# Patient Record
Sex: Male | Born: 1960 | Hispanic: Yes | Marital: Married | Smoking: Never smoker
Health system: Southern US, Community
[De-identification: ages and names within clinical notes are randomized; demographics above are authoritative.]

## PROBLEM LIST (undated history)

## (undated) DIAGNOSIS — I10 Essential (primary) hypertension: Secondary | ICD-10-CM

---

## 2014-08-23 ENCOUNTER — Emergency Department: Payer: Self-pay | Admitting: Emergency Medicine

## 2020-08-13 ENCOUNTER — Encounter: Payer: Self-pay | Admitting: Emergency Medicine

## 2020-08-13 ENCOUNTER — Emergency Department
Admission: EM | Admit: 2020-08-13 | Discharge: 2020-08-13 | Disposition: A | Discharge status: Home / Self Care | Payer: Worker's Compensation | Attending: Emergency Medicine | Admitting: Emergency Medicine

## 2020-08-13 ENCOUNTER — Emergency Department: Payer: Worker's Compensation

## 2020-08-13 ENCOUNTER — Other Ambulatory Visit: Payer: Self-pay

## 2020-08-13 DIAGNOSIS — M7022 Olecranon bursitis, left elbow: Secondary | ICD-10-CM | POA: Diagnosis not present

## 2020-08-13 DIAGNOSIS — I10 Essential (primary) hypertension: Secondary | ICD-10-CM | POA: Insufficient documentation

## 2020-08-13 DIAGNOSIS — M25522 Pain in left elbow: Secondary | ICD-10-CM | POA: Diagnosis present

## 2020-08-13 DIAGNOSIS — W228XXA Striking against or struck by other objects, initial encounter: Secondary | ICD-10-CM | POA: Insufficient documentation

## 2020-08-13 DIAGNOSIS — Y9389 Activity, other specified: Secondary | ICD-10-CM | POA: Insufficient documentation

## 2020-08-13 DIAGNOSIS — Y990 Civilian activity done for income or pay: Secondary | ICD-10-CM | POA: Diagnosis not present

## 2020-08-13 HISTORY — DX: Essential (primary) hypertension: I10

## 2020-08-13 NOTE — ED Provider Notes (Signed)
[facility] Emergency Department Provider Note  ____________________________________________   Event Date/Time   First MD Initiated Contact with Patient 08/13/20 [phone]     (approximate)  I have reviewed the triage vital signs and the nursing notes.   HISTORY  Chief Complaint Elbow Pain    HPI Christopher Strong is a 60 y.o. male presents emergency department complaining of left elbow pain.  Patient states he hit it about 3 weeks ago at work.  Continues to have swelling at the elbow.  No fever or chills.  No other injuries reported.    Past Medical History:  Diagnosis Date  . Hypertension     There are no problems to display for this patient.   History reviewed. No pertinent surgical history.  Prior to Admission medications   Not on File    Allergies Patient has no known allergies.  No family history on file.  Social History Social History   Tobacco Use  . Smoking status: Never Smoker  . Smokeless tobacco: Never Used  Substance Use Topics  . Alcohol use: Never    Review of Systems  Constitutional: No fever/chills Eyes: No visual changes. ENT: No sore throat. Respiratory: Denies cough Genitourinary: Negative for dysuria. Musculoskeletal: Negative for back pain.  Positive right elbow pain Skin: Negative for rash. Psychiatric: no mood changes,     ____________________________________________   PHYSICAL EXAM:  VITAL SIGNS: ED Triage Vitals  Enc Vitals Group     BP 08/13/20 0926 (!) 160/86     Pulse Rate 08/13/20 0926 64     Resp 08/13/20 0926 14     Temp 08/13/20 0926 97.9 F (36.6 C)     Temp Source 08/13/20 0926 Oral     SpO2 08/13/20 0926 100 %     Weight --      Height 08/13/20 0925 5' (1.524 m)     Head Circumference --      Peak Flow --      Pain Score 08/13/20 0923 9     Pain Loc --      Pain Edu? --      Excl. in GC? --     Constitutional: Alert and oriented. Well appearing and in no acute  distress. Eyes: Conjunctivae are normal.  Head: Atraumatic. Nose: No congestion/rhinnorhea. Mouth/Throat: Mucous membranes are moist.   Neck:  supple no lymphadenopathy noted Cardiovascular: Normal rate, regular rhythm.  Respiratory: Normal respiratory effort.  No retractions, GU: deferred Musculoskeletal: FROM all extremities, warm and well perfused, olecranon bursa is swollen and tender, full range of motion of the left elbow, neurovascular intact Neurologic:  Normal speech and language.  Skin:  Skin is warm, dry and intact. No rash noted. Psychiatric: Mood and affect are normal. Speech and behavior are normal.  ____________________________________________   LABS (all labs ordered are listed, but only abnormal results are displayed)  Labs Reviewed - No data to display ____________________________________________   ____________________________________________  RADIOLOGY  X-ray of the left elbow  ____________________________________________   PROCEDURES  Procedure(s) performed: No  Procedures    ____________________________________________   INITIAL IMPRESSION / ASSESSMENT AND PLAN / ED COURSE  Pertinent labs & imaging results that were available during my care of the patient were reviewed by me and considered in my medical decision making (see chart for details).   Patient is a 60 year old male presents with left elbow injury.  See HPI.  Physical exam shows patient to appear stable  X-ray of the left elbow  X-ray left elbow reviewed by me confirmed by radiology is negative for any fracture, soft tissue swelling noted  I did explain everything via the [facility] interpreter.  Patient has olecranon bursitis secondary to trauma.  Explained to him that if we drained the area it will just feel back up with blood..  We placed a Ace wrap on the left elbow.  He is apply ice.  Take Tylenol or ibuprofen for pain as needed.  Return if worsening.  Follow-up orthopedics if not  better in 7 to 10 days.     Christopher Strong was evaluated in Emergency Department on 08/13/2020 for the symptoms described in the history of present illness. He was evaluated in the context of the global COVID-19 pandemic, which necessitated consideration that the patient might be at risk for infection with the SARS-CoV-2 virus that causes COVID-19. Institutional protocols and algorithms that pertain to the evaluation of patients at risk for COVID-19 are in a state of rapid change based on information released by regulatory bodies including the CDC and federal and state organizations. These policies and algorithms were followed during the patient's care in the ED.    As part of my medical decision making, I reviewed the following data within the electronic MEDICAL RECORD NUMBER Nursing notes reviewed and incorporated, Interpreter needed, Old chart reviewed, Radiograph reviewed , Notes from prior ED visits and [place] Controlled Substance Database  ____________________________________________   FINAL CLINICAL IMPRESSION(S) / ED DIAGNOSES  Final diagnoses:  Olecranon bursitis of left elbow      NEW MEDICATIONS STARTED DURING THIS VISIT:  New Prescriptions   No medications on file     Note:  This document was prepared using Dragon voice recognition software and may include unintentional dictation errors.    Faythe Ghee, PA-C 08/13/20 1043    Phineas Semen, MD 08/13/20 1125

## 2020-08-13 NOTE — ED Notes (Signed)
See triage note  Presents with swelling to left elbow  States he hit it about 3 weeks ago conts to have swelling at elbow area

## 2020-08-13 NOTE — ED Triage Notes (Signed)
Injured left elbow at work 3 weeks ago.  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  Has knot on elbow

## 2021-07-08 IMAGING — DX DG ELBOW COMPLETE 3+V*L*
4 series · 4 of 4 positions shown · non-contrast
Comparison: None

CLINICAL DATA: Hit LEFT elbow 3 weeks ago, continued pain and
swelling, knot at posterior elbow

EXAM:
LEFT ELBOW - COMPLETE 3+ VIEW

[elbow ap]
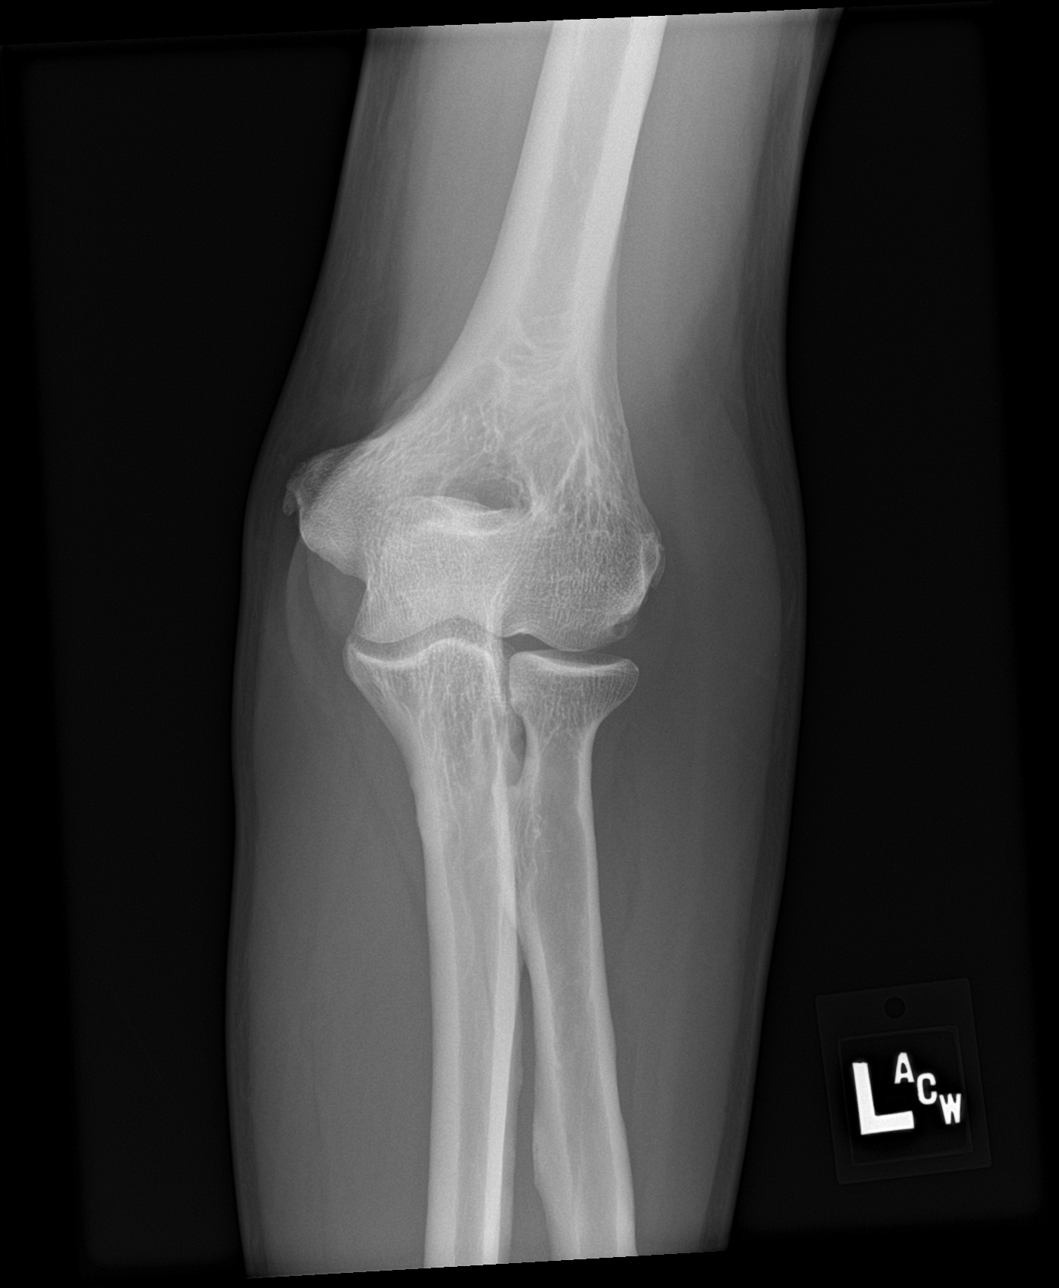

[elbow obl (1 of 2)]
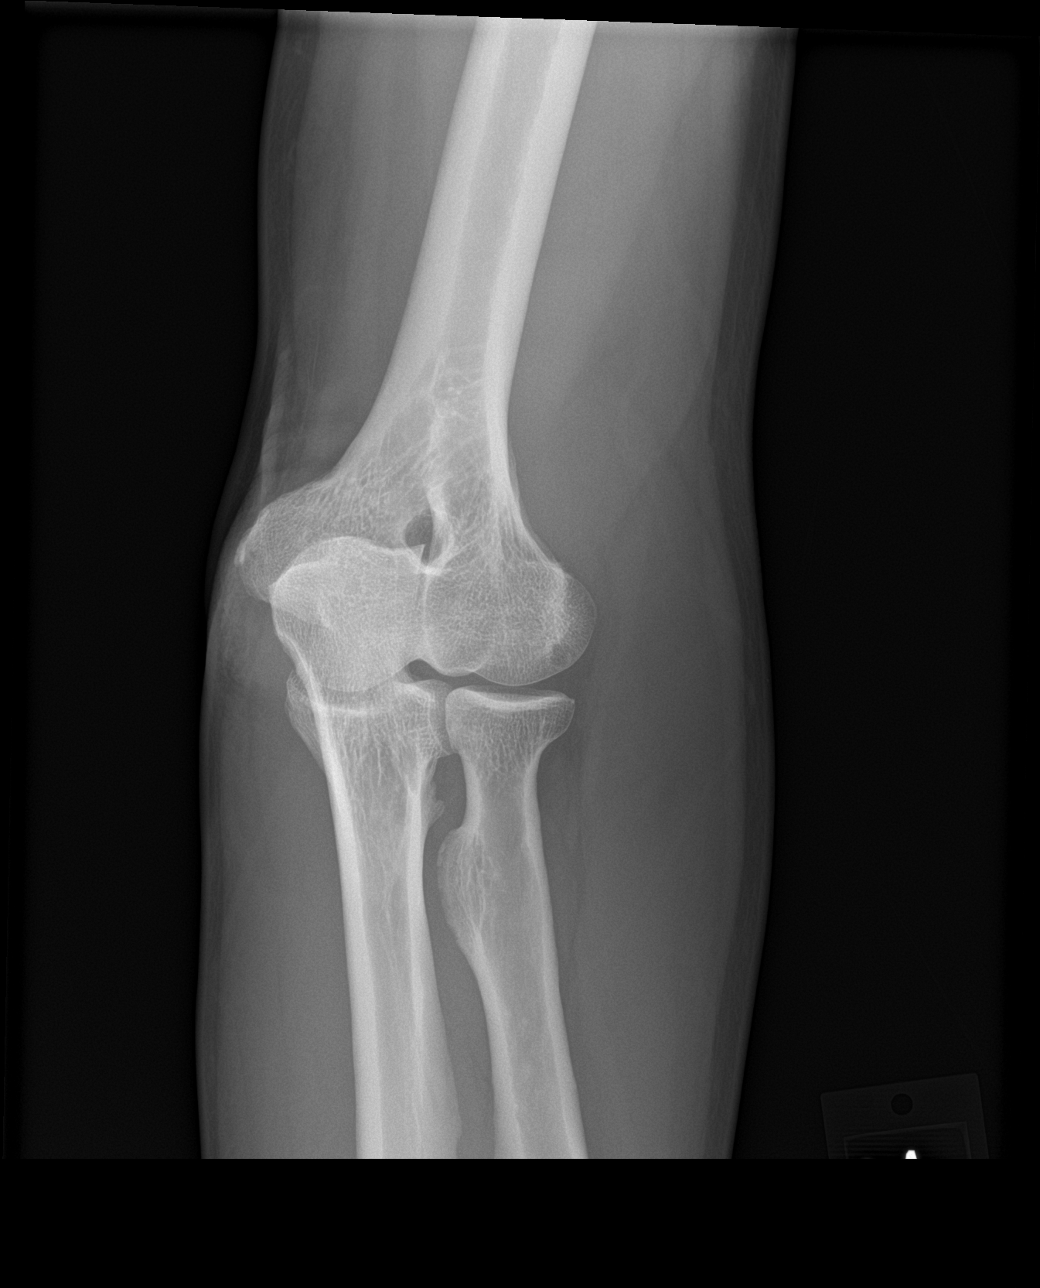

[elbow obl (2 of 2)]
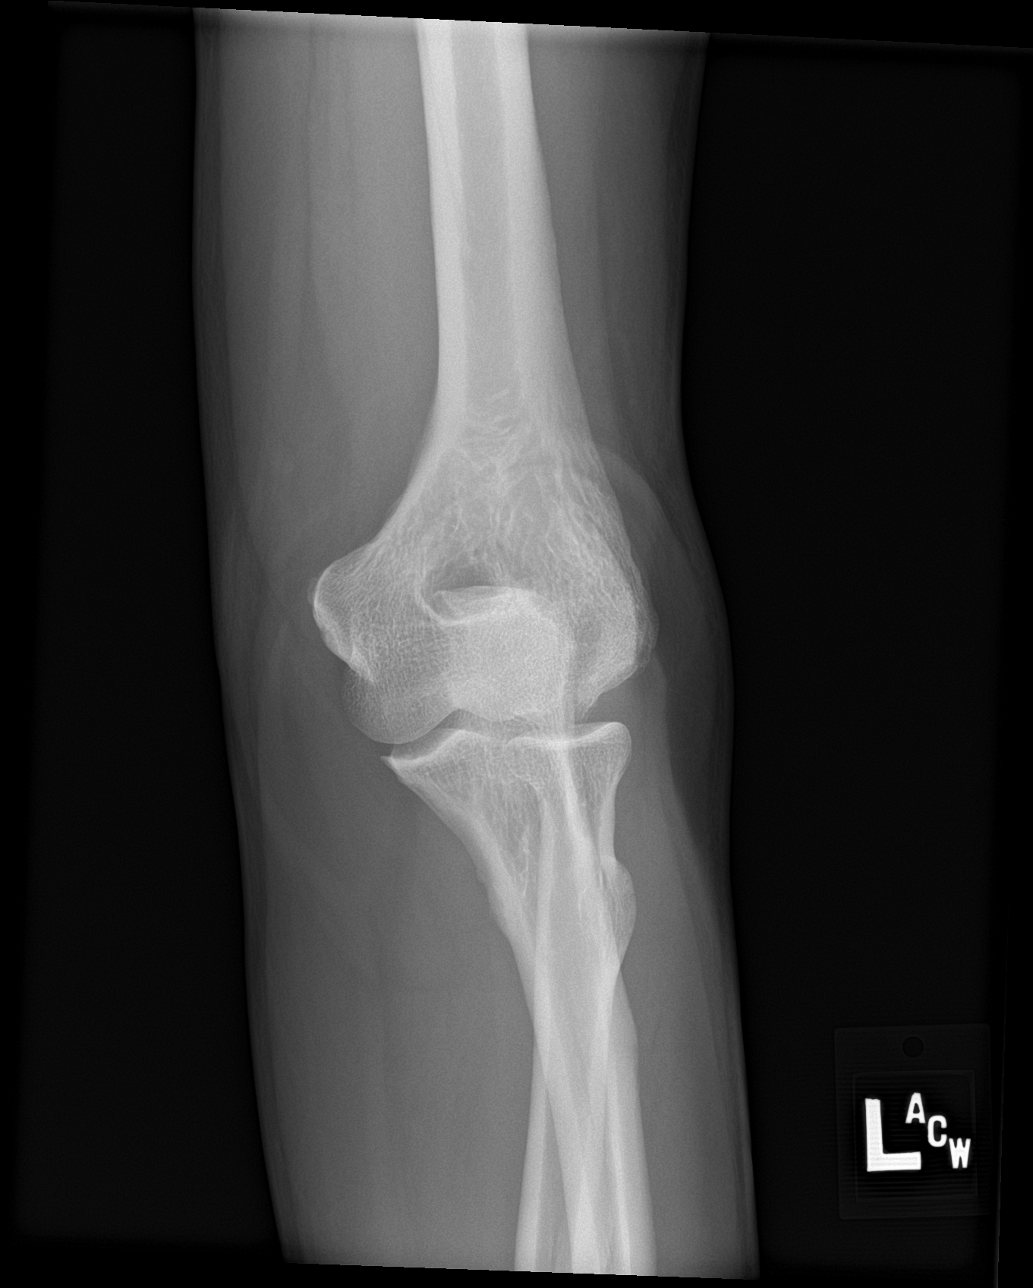

[elbow lat]
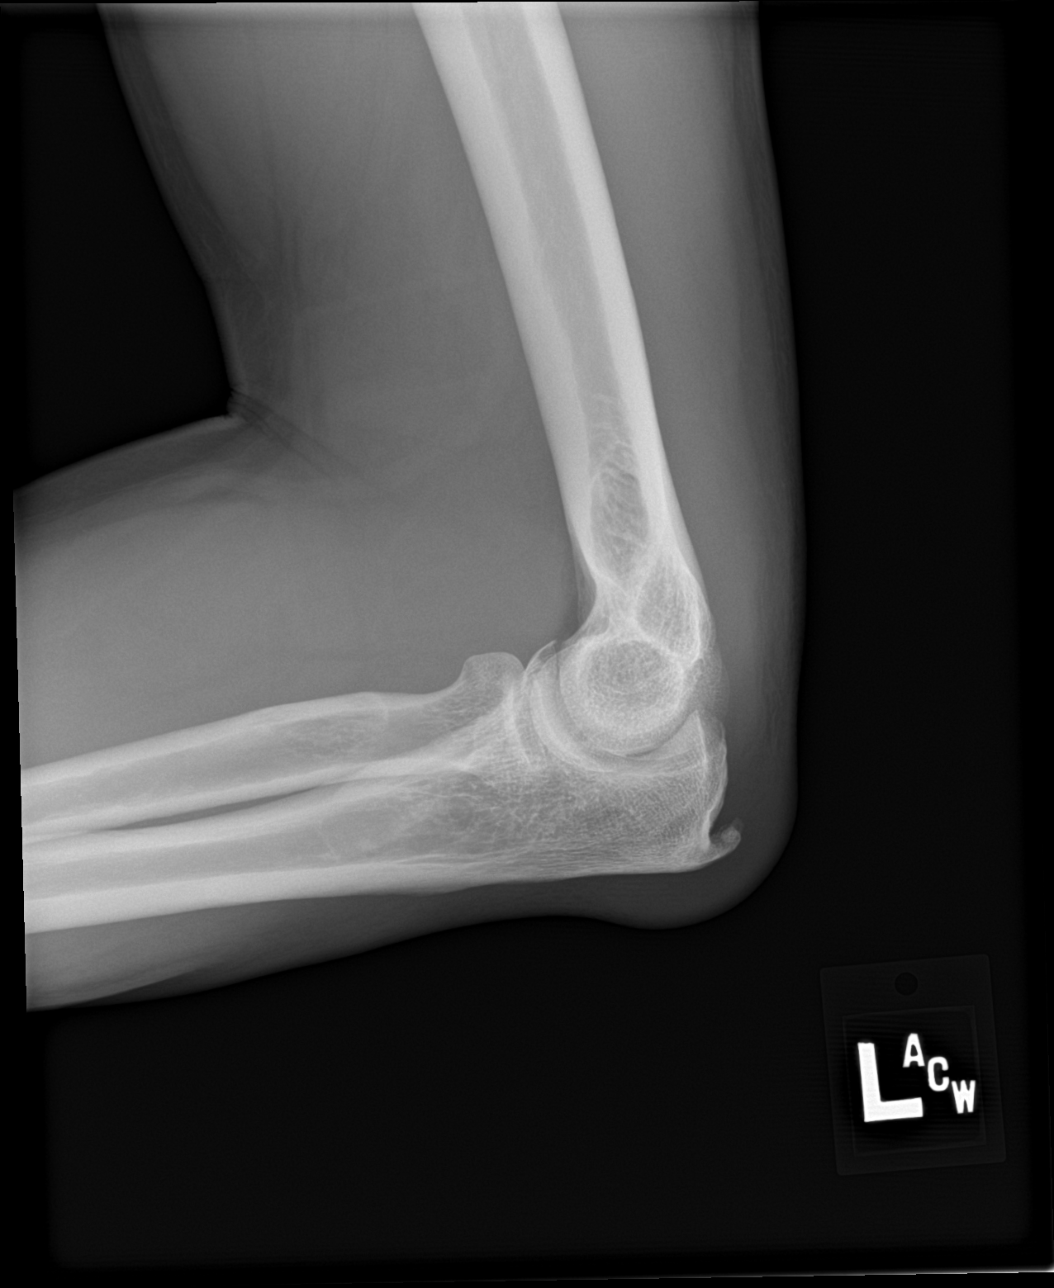

[4 of 4 positions shown; findings below may reference images not displayed]

FINDINGS: Mild dorsal soft tissue swelling overlying olecranon.

Small echo non spur present.

Osseous mineralization normal.

Joint spaces preserved.

No acute fracture, dislocation, bone destruction or joint effusion.
IMPRESSION: Olecranon spurring with overlying soft tissue swelling.

No acute osseous abnormalities.

## 2022-04-09 ENCOUNTER — Other Ambulatory Visit: Payer: Self-pay

## 2022-04-09 ENCOUNTER — Emergency Department
Admission: EM | Admit: 2022-04-09 | Discharge: 2022-04-09 | Disposition: A | Discharge status: Home / Self Care | Payer: Worker's Compensation | Attending: Emergency Medicine | Admitting: Emergency Medicine

## 2022-04-09 ENCOUNTER — Emergency Department: Payer: Worker's Compensation

## 2022-04-09 ENCOUNTER — Encounter: Payer: Self-pay | Admitting: Emergency Medicine

## 2022-04-09 DIAGNOSIS — S8991XA Unspecified injury of right lower leg, initial encounter: Secondary | ICD-10-CM | POA: Diagnosis present

## 2022-04-09 DIAGNOSIS — Y9301 Activity, walking, marching and hiking: Secondary | ICD-10-CM | POA: Diagnosis not present

## 2022-04-09 DIAGNOSIS — X500XXA Overexertion from strenuous movement or load, initial encounter: Secondary | ICD-10-CM | POA: Insufficient documentation

## 2022-04-09 DIAGNOSIS — S86911A Strain of unspecified muscle(s) and tendon(s) at lower leg level, right leg, initial encounter: Secondary | ICD-10-CM

## 2022-04-09 MED ORDER — MELOXICAM 15 MG PO TABS: 15.0000 mg | ORAL_TABLET | Freq: Every day | ORAL | 0 refills | Status: AC

## 2022-04-09 MED ORDER — MELOXICAM 7.5 MG PO TABS
15.0000 mg | ORAL_TABLET | Freq: Once | ORAL | Status: AC
  Administered 2022-04-09: 15 mg via ORAL
  Filled 2022-04-09: qty 2

## 2022-04-09 NOTE — ED Provider Notes (Signed)
[facility] Provider Note  Patient Contact: 10:07 PM (approximate)   History   Knee Pain   HPI  Christopher Strong is a 61 y.o. male who presents the emergency department complaining of right knee pain/injury.  Patient was walking when he slipped, twisted his knee.  Patient did not land on it.  Is having pain along the medial aspect.  No edema is reported.  No medications prior to arrival.  No history of previous injuries.     Physical Exam   Triage Vital Signs: ED Triage Vitals  Enc Vitals Group     BP 04/09/22 1938 (!) 178/92     Pulse Rate 04/09/22 1938 65     Resp 04/09/22 1938 16     Temp 04/09/22 1938 98.2 F (36.8 C)     Temp Source 04/09/22 1938 Oral     SpO2 04/09/22 1938 96 %     Weight 04/09/22 1939 176 lb (79.8 kg)     Height 04/09/22 1939 5\' 6"  (1.676 m)     Head Circumference --      Peak Flow --      Pain Score 04/09/22 1939 9     Pain Loc --      Pain Edu? --      Excl. in GC? --     Most recent vital signs: Vitals:   04/09/22 1938  BP: (!) 178/92  Pulse: 65  Resp: 16  Temp: 98.2 F (36.8 C)  SpO2: 96%     General: Alert and in no acute distress.  Cardiovascular:  Good peripheral perfusion Respiratory: Normal respiratory effort without tachypnea or retractions. Lungs CTAB.  Musculoskeletal: Full range of motion to all extremities.  Visualization of the right knee reveals no obvious deformity.  Slight edema along the medial aspect of the knee.  No ecchymosis.  No open wounds.  Varus, valgus, Lachman's was negative.  Results pedis pulse intact distally.  Sensation intact distally. Neurologic:  No gross focal neurologic deficits are appreciated.  Skin:   No rash noted Other:   ED Results / Procedures / Treatments   Labs (all labs ordered are listed, but only abnormal results are displayed) Labs Reviewed - No data to display   EKG     RADIOLOGY  I personally viewed, evaluated, and interpreted these  images as part of my medical decision making, as well as reviewing the written report by the radiologist.  ED Provider Interpretation: No acute fracture or dislocation identified of the right knee  DG Knee Complete 4 Views Right  Result Date: 04/09/2022 CLINICAL DATA:  Right knee pain EXAM: RIGHT KNEE - COMPLETE 4+ VIEW COMPARISON:  None Available. FINDINGS: No evidence of fracture, dislocation, or joint effusion. No evidence of arthropathy or other focal bone abnormality. Soft tissues are unremarkable. IMPRESSION: Negative. Electronically Signed   By: 04/11/2022 M.D.   On: 04/09/2022 20:25    PROCEDURES:  Critical Care performed: No  Procedures   MEDICATIONS ORDERED IN ED: Medications  meloxicam (MOBIC) tablet 15 mg (has no administration in time range)     IMPRESSION / MDM / ASSESSMENT AND PLAN / ED COURSE  I reviewed the triage vital signs and the nursing notes.                              Differential diagnosis includes, but is not limited to, knee sprain, fracture, patellar dislocation.  Patient's presentation is most consistent with acute presentation with potential threat to life or bodily function.   Patient's diagnosis is consistent with knee sprain. Patient presents the emergency department after slipping and twisting his right knee.  Patient is complaining of pain along the medial aspect.  No obvious deformity.x-ray was reassuring with no fracture or dislocation.  Special test the knee are not overly concerning for ligamentous tear.  Patient be given brace, anti-inflammatory for symptom improvement.  Follow-up with orthopedics if symptoms or not improving with conservative measures.   Patient is given ED precautions to return to the ED for any worsening or new symptoms.        FINAL CLINICAL IMPRESSION(S) / ED DIAGNOSES   Final diagnoses:  Strain of right knee, initial encounter     Rx / DC Orders   ED Discharge Orders          Ordered     meloxicam (MOBIC) 15 MG tablet  Daily        04/09/22 2222             Note:  This document was prepared using Dragon voice recognition software and may include unintentional dictation errors.   Brynda Peon 04/09/22 2223    Rada Hay, MD 04/11/22 [phone]

## 2022-04-09 NOTE — ED Notes (Signed)
Brace applied to right knee. DC instructions given verbally and in writing, understanding voiced.  Signature pad in room not working.  Pt left ambulatory in stable condition to POV.

## 2022-04-09 NOTE — ED Notes (Signed)
Ice pack applied.

## 2022-04-09 NOTE — ED Triage Notes (Signed)
Pt reports while at work he slipped and fell hitting his right knee, reports pain to right knee. Pt denies hitting his head. Pt talks in complete sentences no respiratory distress noted
